# Patient Record
Sex: Male | Born: 1980 | Hispanic: No | Marital: Single | Smoking: Never smoker
Health system: Southern US, Community
[De-identification: ages and names within clinical notes are randomized; demographics above are authoritative.]

## PROBLEM LIST (undated history)

## (undated) DIAGNOSIS — G510 Bell's palsy: Secondary | ICD-10-CM

## (undated) HISTORY — DX: Bell's palsy: G51.0

---

## 2002-01-26 ENCOUNTER — Encounter: Payer: Self-pay | Admitting: Internal Medicine

## 2002-01-26 ENCOUNTER — Ambulatory Visit (HOSPITAL_COMMUNITY): Admission: RE | Admit: 2002-01-26 | Discharge: 2002-01-26 | Payer: Self-pay | Admitting: Internal Medicine

## 2003-03-16 ENCOUNTER — Emergency Department (HOSPITAL_COMMUNITY): Admission: EM | Admit: 2003-03-16 | Discharge: 2003-03-16 | Payer: Self-pay | Admitting: Emergency Medicine

## 2003-09-02 ENCOUNTER — Encounter: Admission: RE | Admit: 2003-09-02 | Discharge: 2003-09-02 | Payer: Self-pay | Admitting: Internal Medicine

## 2003-09-26 ENCOUNTER — Encounter: Admission: RE | Admit: 2003-09-26 | Discharge: 2003-09-26 | Payer: Self-pay | Admitting: Internal Medicine

## 2009-02-06 ENCOUNTER — Encounter: Payer: Self-pay | Admitting: *Deleted

## 2009-02-15 ENCOUNTER — Emergency Department (HOSPITAL_COMMUNITY): Admission: EM | Admit: 2009-02-15 | Discharge: 2009-02-15 | Payer: Self-pay | Admitting: Emergency Medicine

## 2009-10-31 ENCOUNTER — Emergency Department (HOSPITAL_COMMUNITY): Admission: EM | Admit: 2009-10-31 | Discharge: 2009-10-31 | Payer: Self-pay | Admitting: Emergency Medicine

## 2009-11-10 ENCOUNTER — Emergency Department (HOSPITAL_COMMUNITY): Admission: EM | Admit: 2009-11-10 | Discharge: 2009-11-10 | Payer: Self-pay | Admitting: Emergency Medicine

## 2009-11-25 ENCOUNTER — Ambulatory Visit (HOSPITAL_COMMUNITY): Admission: RE | Admit: 2009-11-25 | Discharge: 2009-11-25 | Payer: Self-pay | Admitting: Emergency Medicine

## 2011-01-06 LAB — POCT I-STAT, CHEM 8
BUN: 15 mg/dL (ref 6–23)
Calcium, Ion: 1.13 mmol/L (ref 1.12–1.32)
Chloride: 101 mEq/L (ref 96–112)
Creatinine, Ser: 1 mg/dL (ref 0.4–1.5)
Glucose, Bld: 100 mg/dL — ABNORMAL HIGH (ref 70–99)
HCT: 47 % (ref 39.0–52.0)
Hemoglobin: 16 g/dL (ref 13.0–17.0)
Potassium: 3.4 mEq/L — ABNORMAL LOW (ref 3.5–5.1)
Sodium: 137 mEq/L (ref 135–145)
TCO2: 29 mmol/L (ref 0–100)

## 2011-01-30 LAB — POCT URINALYSIS DIP (DEVICE)
Bilirubin Urine: NEGATIVE
Glucose, UA: NEGATIVE mg/dL
Hgb urine dipstick: NEGATIVE
Ketones, ur: NEGATIVE mg/dL
Nitrite: NEGATIVE
Protein, ur: 30 mg/dL — AB
Specific Gravity, Urine: 1.02 (ref 1.005–1.030)
Urobilinogen, UA: 1 mg/dL (ref 0.0–1.0)
pH: 7 (ref 5.0–8.0)

## 2011-07-31 IMAGING — CT CT HEAD W/O CM
1 of 2 series · 13 of 30 positions shown, 17 images · non-contrast
Comparison: None

CLINICAL DATA: The left facial numbness.  Blurred vision.

CT HEAD WITHOUT CONTRAST
TECHNIQUE: Contiguous axial images were obtained from the base of
the skull through the vertex without contrast.

[Series 2: brain · axial · 0.47mm/px · z∈[+159,+292]mm · 13 of 32 slices shown, 17 images]
[im 3/32  brain]
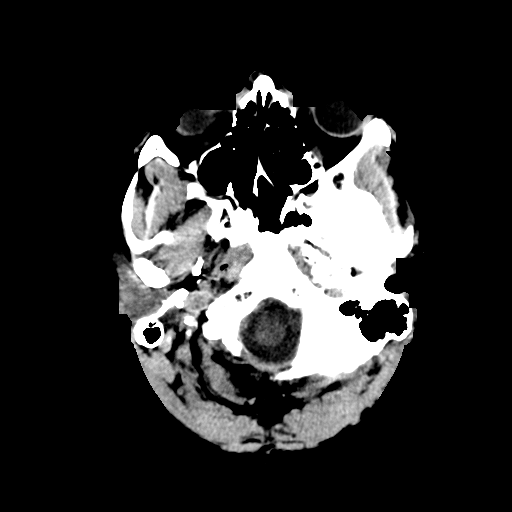
[im 3/32  bone]
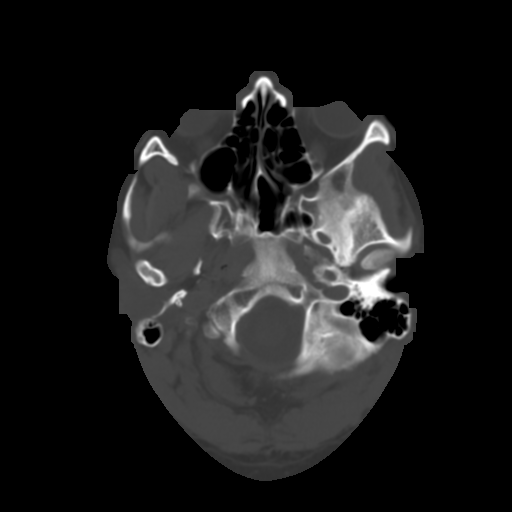
[im 5/32  brain]
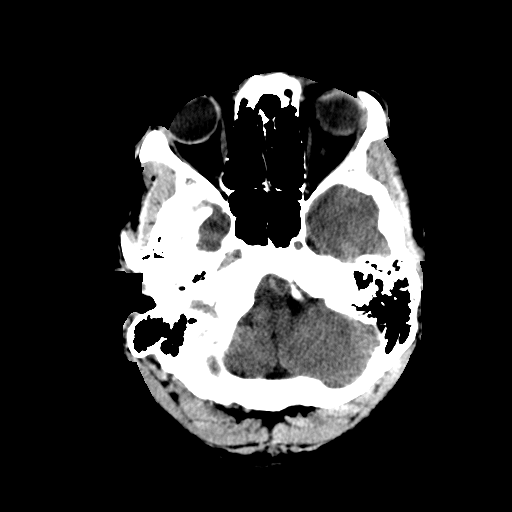
[im 7/32  brain]
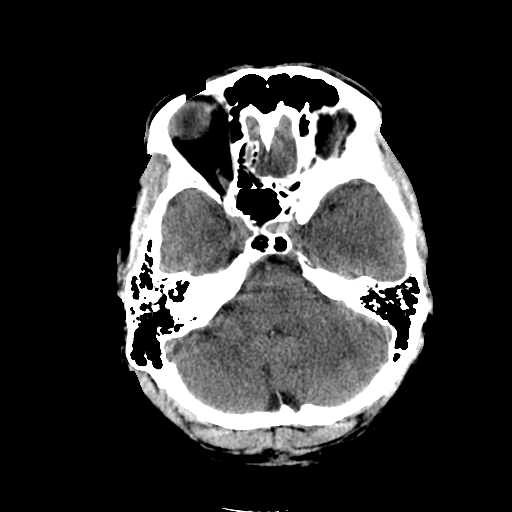
[im 9/32  brain]
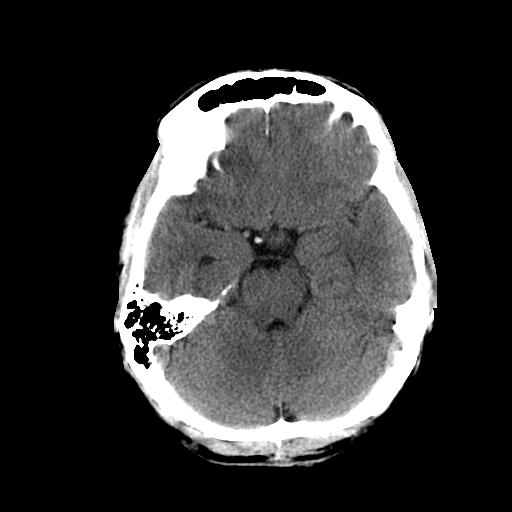
[im 12/32  brain]
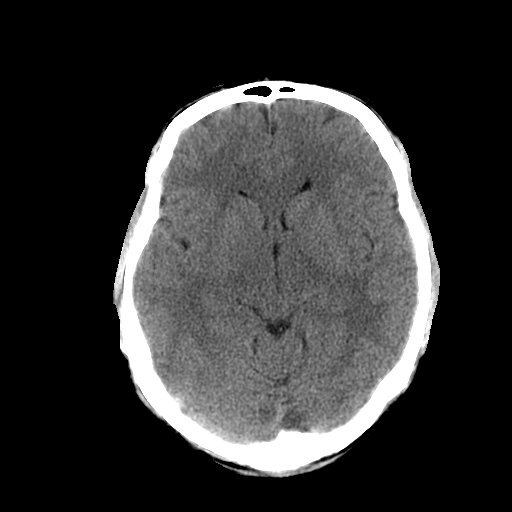
[im 12/32  bone]
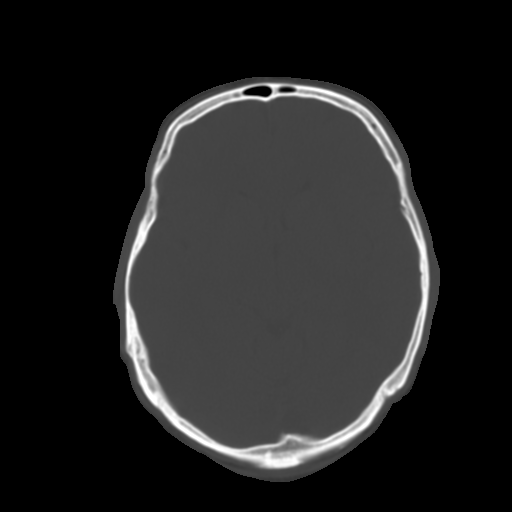
[im 14/32  brain]
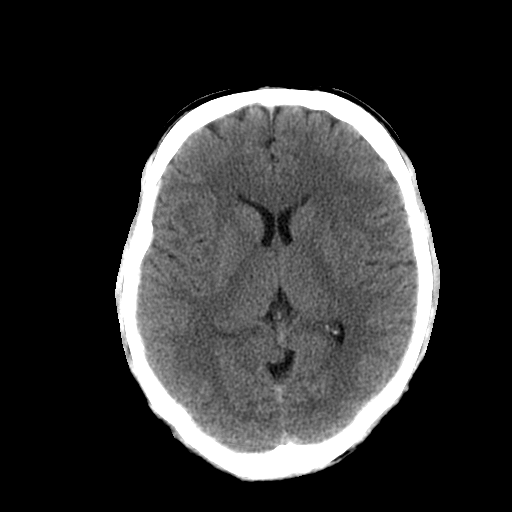
[im 16/32  brain]
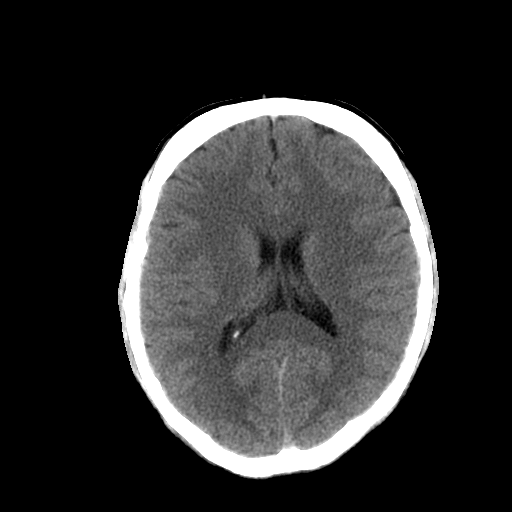
[im 18/32  brain]
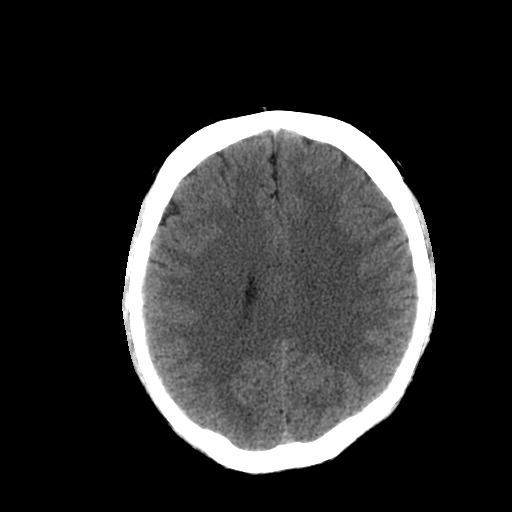
[im 20/32  brain]
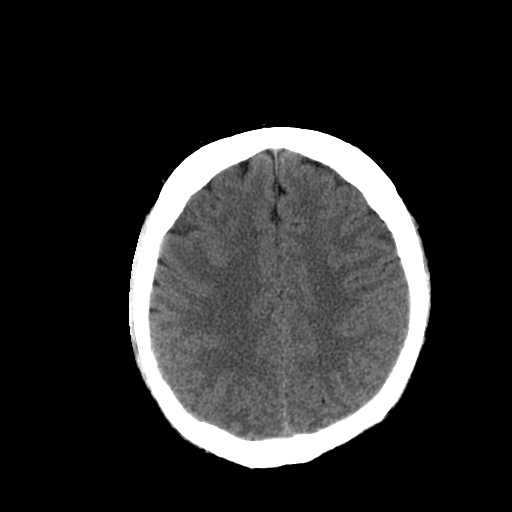
[im 20/32  bone]
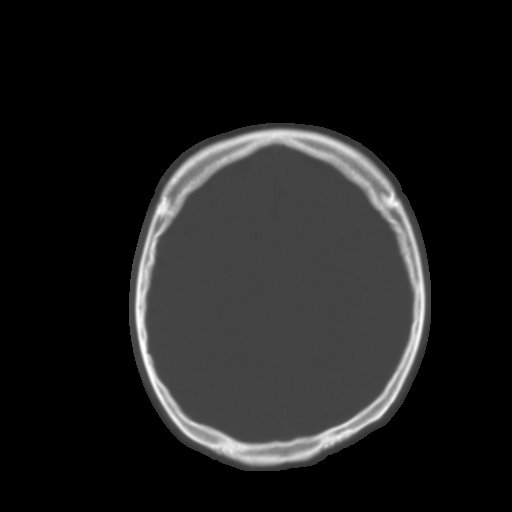
[im 23/32  brain]
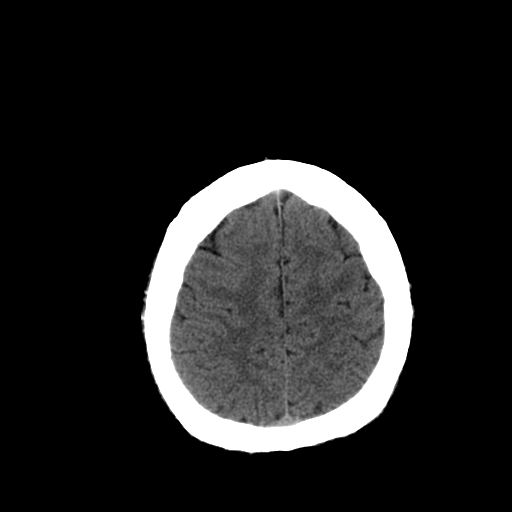
[im 25/32  brain]
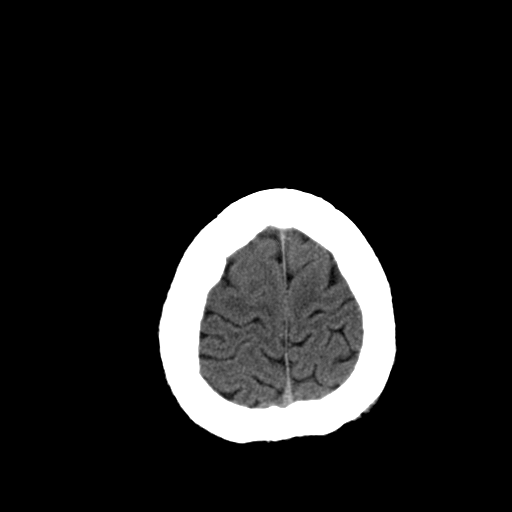
[im 27/32  brain]
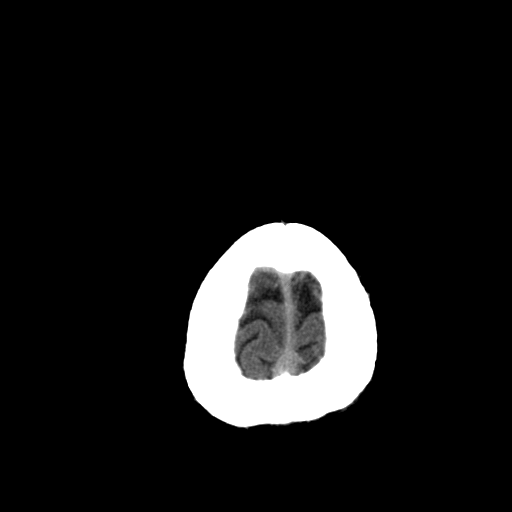
[im 29/32  brain]
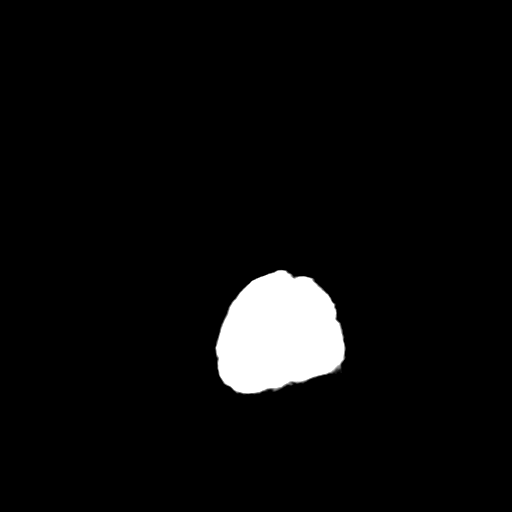
[im 29/32  bone]
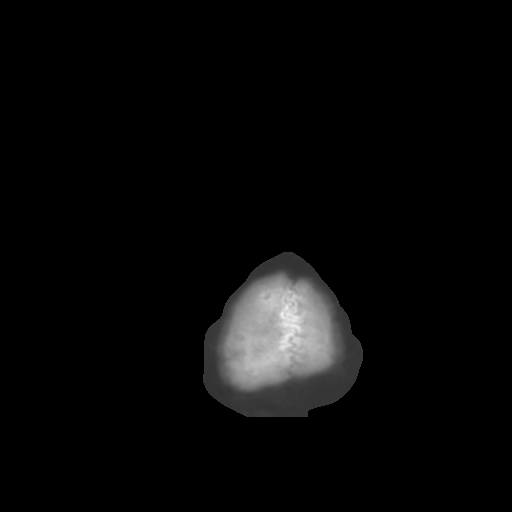

[13 of 30 positions shown; findings below may reference images not displayed]

FINDINGS: The brain has a normal appearance without evidence of
malformation, atrophy, old or acute infarction, mass lesion,
hemorrhage, hydrocephalus or extra-axial collection.  The calvarium
is normal.  The sinuses, middle ears and mastoids are clear.
IMPRESSION: Normal head CT

## 2012-10-15 ENCOUNTER — Ambulatory Visit (INDEPENDENT_AMBULATORY_CARE_PROVIDER_SITE_OTHER): Payer: BC Managed Care – PPO | Admitting: Emergency Medicine

## 2012-10-15 VITALS — BP 130/84 | HR 100 | Temp 100.7°F | Resp 16 | Ht 67.0 in | Wt 202.6 lb

## 2012-10-15 DIAGNOSIS — J111 Influenza due to unidentified influenza virus with other respiratory manifestations: Secondary | ICD-10-CM

## 2012-10-15 MED ORDER — OSELTAMIVIR PHOSPHATE 75 MG PO CAPS: 75.0000 mg | ORAL_CAPSULE | Freq: Two times a day (BID) | ORAL | Status: DC

## 2012-10-15 MED ORDER — HYDROCOD POLST-CHLORPHEN POLST 10-8 MG/5ML PO LQCR: 5.0000 mL | Freq: Two times a day (BID) | ORAL | Status: DC | PRN

## 2012-10-15 NOTE — Patient Instructions (Signed)
Influenza A (H1N1) (Influenza A [H1N1]) La gripe H1N1, llamada "gripe porcina" es producida por un nuevo virus de influenza. El virus H1N1 es diferente del virus de la influenza estacional. Las ltimas pruebas muestran que el virus H1N1 tiene genes similares a los del virus de la gripe que afecta a los cerdos en [place] y [place]. Los sntomas son similares a los de la gripe estacional y se disemina de persona a persona. Usted puede tener riesgo de sufrir mayores complicaciones si tiene una enfermedad crnica subyacente. El CDC y la [employer] la [employer]ud estn controlando los casos informados en todo el mundo. CAUSAS  La gripe se contagia a travs de las gotitas que se eliminan con la tos y los estornudos.  Una persona puede infectarse tocando algo que contenga el virus y luego tocndose la boca o la nariz. SNTOMAS  Fiebre.  Dolor de cabeza.  Cansancio.  Tos.  Dolor de garganta.  Congestin nasal o que gotea.  Dolores en el cuerpo.  Tambin pueden existir diarrea y vmitos. Estos sntomas se conocen como "sntomas caractersticos de la gripe". Muchas enfermedades distintas, incluso el resfro comn, pueden tener sntomas similares. DIAGNSTICO  Existen anlisis que permiten diagnosticar si usted tiene el virus H1N1.  Todos los casos confirmados sern informados al departamento de salud estatal o local.  Puede ser necesario que su mdico le realice pruebas para determinar si tiene otra infeccin que pueda ser una complicacin de la gripe. INSTRUCCIONES PARA EL CUIDADO DOMICILIARIO  Busque ayuda de inmediato si desarrolla alguno de los siguientes sntomas.  Si est en riesgo de sufrir complicaciones de la gripe, deber consultar a un profesional de la salud cuando comiencen los sntomas. Las personas que tienen un alto riesgo de complicaciones son:  Mayores de 65 aos de edad.  Pacientes con enfermedades crnicas.  Mujeres embarazadas.  Nios pequeos.  El  profesional que lo asiste podr recomendar la utilizacin de ciertas drogas antivirales para el tratamiento de la gripe.  Si contrae gripe, es aconsejable que descanse lo suficiente, beba gran cantidad de lquidos y evite el consumo de alcohol y tabaco.  Tambin puede tomar medicamentos de venta libre que alivien los sntomas de la gripe, si se lo permite el profesional que lo asiste. (Nunca de aspirina a nios o adolescentes que tienen sntomas de la gripe, particularmente fiebre). TRATAMIENTO Si contrae la enfermedad, hay drogas antivirales disponibles. Estos medicamentos pueden hacer que su enfermedad sea ms leve y a que mejore ms rpidamente. El tratamiento debe comenzar poco despus del inicio de la enfermedad. Slo el mdico puede prescribir medicamentos antivirales. PREVENCIN  Cbrase la boca y la nariz con un tis o con la mano cuando tosa o estornude. Descarte el tis.  Lave sus manos frecuentemente con agua y jabn. Utilice un limpiador a base de alcohol si no dispone de agua.  Evite tocarse los ojos, la nariz o la boca.  Trate de evitar el contacto con personas enfermas.  Si est enfermo, qudese en su casa y no concurra al trabajo o a la escuela para evitar el contagio a otras personas. Las personas infectadas con el virus H1N1 pueden infectar a otras desde un da antes de tener los sntomas hasta 5 a 7 das despus.  Hay disponible una vacuna que ayuda a la proteccin contra el virus H1N1. Adems de esta vacuna, deber vacunarse contra la gripe estacional. Estas dos vacunas deben aplicarse el mismo da. El CDC recomienda especialmente la vacuna contra el virus H1N1 en:    Mujeres embarazadas.  Personas que viven con nios menores de 6 meses o cuidan de ellos.  Personal de los servicios de salud y emergencias.  Personas entre los 6 meses y los 24 aos de edad.  Personas entre 25 y 64 aos que tienen ms riesgo si contraen el virus H1N1 por que padecen enfermedades crnicas o  porque tienen compromiso de su sistema inmunolgico. BARBIJOS: En comunidades y hogares no se recomienda el uso de mascarillas ni barbijos N95. En ciertas circunstancias, pueden utilizarlos las personas que tienen ms riesgo de desarrollar cuadros graves de influenza. Su mdico le podr dar otras tras recomendaciones para el uso de las mascarillas. SIGNOS DE ALERTA POR TRATARSE DE UNA EMERGENCIA QUE NECESITAN ATENCIN MDICA DE URGENCIA TANTO EN NIOS :  Respiracin rpida o problemas para respirar.  Color de piel azulado.  No bebe suficiente cantidad de lquidos.  No se despierta o no interacta.  Est tan irritable que no quiere que se lo cargue.  Su nio tienen una temperatura oral de ms de 102 F (38.9 C) y no puede controlarla con medicamentos.  Su beb tiene ms de 3 meses y su temperatura rectal es de 102 F (38.9 C) o ms.  Su beb tiene 3 meses o menos y su temperatura rectal es de 100.4 F (38 C) o ms.  Fiebre con erupcin cutnea. SIGNOS DE ALERTA POR TRATARSE DE UNA EMERGENCIA QUE NECESITAN ATENCIN MDICA DE URGENCIA TANTO EN ADULTOS:  Le falta la respiracin o presenta dificultades respiratorias.  Dolor o presin en el pecho o abdomen.  Mareos repentinos.  Confusin.  Vmitos intensos y persistentes.  Est tan irritable que no quiere que se lo cargue.  Usted una temperatura oral de ms de 102 F (38.9 C) y no puede controlarla con medicamentos.  Los sntomas mejoran pero luego vuelven con fiebre y la tos empeora. SOLICITE ATENCIN MDICA DE INMEDIATO SI OBSERVA:  Usted o alguien que conoce tiene los sntomas descritos arriba. Cuando llegue al centro de emergencias, comunique en la recepcin que cree tener gripe. Es posible que le pidan que utilice una mscara y/o ubicarse en un rea aislada para evitar que otros se contagien. EST SEGURO QUE:   Comprende las instrucciones para el alta mdica.  Controlar su enfermedad.  Solicitar atencin mdica de  inmediato segn las indicaciones. Parte de esta informacin es cortesa de los CDC. Document Released: 03/25/2008 Document Revised: 12/30/2011 [employer] Patient Information 2013 [employer].  

## 2012-10-15 NOTE — Progress Notes (Signed)
[facility] [address], [place] [address] [phone]  Date:  10/15/2012   Name:  Albert Farrell   DOB:  [birth year]   MRN:  [id number]  PCP:  No primary provider on file.    Chief Complaint: Headache and Sore Throat   History of Present Illness:  Albert Farrell is a 31 y.o. very pleasant male patient who presents with the following:  Ill since Tuesday with fever and chills, malaise and myalgias and a cough that is non productive mostly but occasionally productive of purulent sputum.  No nasal drainage or post nasal drip.  No wheezing or shortness of breath.  No improvement with OTC medication.  No stool change or rash.  No improvement with OTC medication.  Multiple family members treated with flu.  There is no problem list on file for this patient.   No past medical history on file.  No past surgical history on file.  History  Substance Use Topics  . Smoking status: Never Smoker   . Smokeless tobacco: Not on file  . Alcohol Use: Not on file    No family history on file.  No Known Allergies  Medication list has been reviewed and updated.  No current outpatient prescriptions on file prior to visit.    Review of Systems:  As per HPI, otherwise negative.    Physical Examination: Filed Vitals:   10/15/12 1654  BP: 130/84  Pulse: 100  Temp: 100.7 F (38.2 C)  Resp: 16   Filed Vitals:   10/15/12 1654  Height: 5\' 7"  (1.702 m)  Weight: 202 lb 9.6 oz (91.899 kg)   Body mass index is 31.73 kg/(m^2). Ideal Body Weight: Weight in (lb) to have BMI = 25: 159.3   GEN: WDWN, NAD, Non-toxic, A & O x 3 HEENT: Atraumatic, Normocephalic. Neck supple. No masses, No LAD. Ears and Nose: No external deformity. CV: RRR, No M/G/R. No JVD. No thrill. No extra heart sounds. PULM: CTA B, no wheezes, crackles, rhonchi. No retractions. No resp. distress. No accessory muscle use. ABD: S, NT, ND, +BS. No rebound. No HSM. EXTR: No c/c/e NEURO  Normal gait.  PSYCH: Normally interactive. Conversant. Not depressed or anxious appearing.  Calm demeanor.    Assessment and Plan: Influenza tamiflu tussionex Follow up as needed  Carmelina Dane, MD

## 2012-10-16 ENCOUNTER — Ambulatory Visit (INDEPENDENT_AMBULATORY_CARE_PROVIDER_SITE_OTHER): Payer: BC Managed Care – PPO | Admitting: Emergency Medicine

## 2012-10-16 VITALS — BP 118/80 | HR 80 | Temp 98.0°F | Resp 18 | Ht 68.5 in | Wt 199.6 lb

## 2012-10-16 DIAGNOSIS — Z0000 Encounter for general adult medical examination without abnormal findings: Secondary | ICD-10-CM

## 2012-10-16 DIAGNOSIS — J111 Influenza due to unidentified influenza virus with other respiratory manifestations: Secondary | ICD-10-CM

## 2012-10-16 LAB — POCT CBC
Hemoglobin: 16.5 g/dL (ref 14.1–18.1)
Lymph, poc: 1.7 (ref 0.6–3.4)
MCH, POC: 30 pg (ref 27–31.2)
MCHC: 31.7 g/dL — AB (ref 31.8–35.4)
MID (cbc): 0.7 (ref 0–0.9)
MPV: 10 fL (ref 0–99.8)
POC Granulocyte: 8.4 — AB (ref 2–6.9)
POC MID %: 6.1 %M (ref 0–12)
Platelet Count, POC: 219 10*3/uL (ref 142–424)
WBC: 10.7 10*3/uL — AB (ref 4.6–10.2)

## 2012-10-16 LAB — TSH: TSH: 1.365 u[IU]/mL (ref 0.350–4.500)

## 2012-10-16 NOTE — Progress Notes (Signed)
[facility] [address], [place] [address] [phone]  Date:  10/16/2012   Name:  Albert Farrell   DOB:  [birth year]   MRN:  [id number]  PCP:  No primary provider on file.    Chief Complaint: Blood work and Headache   History of Present Illness:  Albert Farrell is a 31 y.o. very pleasant male patient who presents with the following:  Treated yesterday for influenza and has come in today requesting labs to check cholesterol, etc.  Interval has been improvement with medication.  Has some nausea and dizziness intermittently with no other neuro or visual complaints.    There is no problem list on file for this patient.   No past medical history on file.  No past surgical history on file.  History  Substance Use Topics  . Smoking status: Never Smoker   . Smokeless tobacco: Not on file  . Alcohol Use: Not on file    No family history on file.  No Known Allergies  Medication list has been reviewed and updated.  Current Outpatient Prescriptions on File Prior to Visit  Medication Sig Dispense Refill  . chlorpheniramine-HYDROcodone (TUSSIONEX PENNKINETIC ER) 10-8 MG/5ML LQCR Take 5 mLs by mouth every 12 (twelve) hours as needed (cough).  60 mL  0  . oseltamivir (TAMIFLU) 75 MG capsule Take 1 capsule (75 mg total) by mouth 2 (two) times daily.  10 capsule  0  . acetaminophen (TYLENOL) 500 MG tablet Take 500 mg by mouth every 6 (six) hours as needed.      . pseudoephedrine-acetaminophen (TYLENOL SINUS) 30-500 MG TABS Take 1 tablet by mouth every 4 (four) hours as needed.        Review of Systems:  As per HPI, otherwise negative.    Physical Examination: Filed Vitals:   10/16/12 1218  BP: 118/80  Pulse: 80  Temp: 98 F (36.7 C)  Resp: 18   Filed Vitals:   10/16/12 1218  Height: 5' 8.5" (1.74 m)  Weight: 199 lb 9.6 oz (90.538 kg)   Body mass index is 29.91 kg/(m^2). Ideal Body Weight: Weight in (lb) to have BMI = 25: 166.5    GEN: WDWN, NAD, Non-toxic, A & O x 3 HEENT: Atraumatic, Normocephalic. Neck supple. No masses, No LAD. Ears and Nose: No external deformity. CV: RRR, No M/G/R. No JVD. No thrill. No extra heart sounds. PULM: CTA B, no wheezes, crackles, rhonchi. No retractions. No resp. distress. No accessory muscle use. ABD: S, NT, ND, +BS. No rebound. No HSM. EXTR: No c/c/e NEURO Normal gait, balance and coordination.  PRRERLA EOMI  CN 2-12 intact PSYCH: Normally interactive. Conversant. Not depressed or anxious appearing.  Calm demeanor.    Assessment and Plan: Influenza Wellness check Labs Follow up based on labs Continue meds  Carmelina Dane, MD

## 2012-10-17 LAB — COMPREHENSIVE METABOLIC PANEL
ALT: 14 U/L (ref 0–53)
AST: 15 U/L (ref 0–37)
Alkaline Phosphatase: 43 U/L (ref 39–117)
CO2: 27 mEq/L (ref 19–32)
Creat: 0.98 mg/dL (ref 0.50–1.35)
Sodium: 136 mEq/L (ref 135–145)
Total Bilirubin: 0.5 mg/dL (ref 0.3–1.2)
Total Protein: 7.9 g/dL (ref 6.0–8.3)

## 2012-10-17 LAB — LIPID PANEL
LDL Cholesterol: 102 mg/dL — ABNORMAL HIGH (ref 0–99)
Total CHOL/HDL Ratio: 4.1 Ratio
VLDL: 25 mg/dL (ref 0–40)

## 2015-07-20 ENCOUNTER — Ambulatory Visit (INDEPENDENT_AMBULATORY_CARE_PROVIDER_SITE_OTHER): Payer: BLUE CROSS/BLUE SHIELD | Admitting: Family Medicine

## 2015-07-20 VITALS — BP 120/80 | HR 82 | Temp 98.3°F | Resp 16 | Ht 68.5 in | Wt 204.0 lb

## 2015-07-20 DIAGNOSIS — R519 Headache, unspecified: Secondary | ICD-10-CM

## 2015-07-20 DIAGNOSIS — Z1329 Encounter for screening for other suspected endocrine disorder: Secondary | ICD-10-CM

## 2015-07-20 DIAGNOSIS — Z13220 Encounter for screening for lipoid disorders: Secondary | ICD-10-CM

## 2015-07-20 DIAGNOSIS — Z131 Encounter for screening for diabetes mellitus: Secondary | ICD-10-CM

## 2015-07-20 DIAGNOSIS — R51 Headache: Secondary | ICD-10-CM | POA: Diagnosis not present

## 2015-07-20 LAB — POCT CBC
Granulocyte percent: 59.6 %G (ref 37–80)
HCT, POC: 49 % (ref 43.5–53.7)
Hemoglobin: 16.1 g/dL (ref 14.1–18.1)
LYMPH, POC: 3 (ref 0.6–3.4)
MCH, POC: 29.3 pg (ref 27–31.2)
MCHC: 32.8 g/dL (ref 31.8–35.4)
MCV: 89.4 fL (ref 80–97)
MID (CBC): 0.4 (ref 0–0.9)
MPV: 8.4 fL (ref 0–99.8)
PLATELET COUNT, POC: 222 10*3/uL (ref 142–424)
POC Granulocyte: 4.9 (ref 2–6.9)
POC LYMPH %: 35.6 % (ref 10–50)
POC MID %: 4.8 % (ref 0–12)
RBC: 5.48 M/uL (ref 4.69–6.13)
RDW, POC: 14.4 %
WBC: 8.3 10*3/uL (ref 4.6–10.2)

## 2015-07-20 LAB — COMPREHENSIVE METABOLIC PANEL
ALBUMIN: 4.6 g/dL (ref 3.6–5.1)
ALT: 57 U/L — AB (ref 9–46)
AST: 31 U/L (ref 10–40)
Alkaline Phosphatase: 49 U/L (ref 40–115)
BILIRUBIN TOTAL: 0.6 mg/dL (ref 0.2–1.2)
BUN: 15 mg/dL (ref 7–25)
CHLORIDE: 102 mmol/L (ref 98–110)
CO2: 25 mmol/L (ref 20–31)
CREATININE: 0.91 mg/dL (ref 0.60–1.35)
Calcium: 9.5 mg/dL (ref 8.6–10.3)
Glucose, Bld: 105 mg/dL — ABNORMAL HIGH (ref 65–99)
Potassium: 4.1 mmol/L (ref 3.5–5.3)
SODIUM: 136 mmol/L (ref 135–146)
TOTAL PROTEIN: 7.4 g/dL (ref 6.1–8.1)

## 2015-07-20 LAB — LIPID PANEL
CHOL/HDL RATIO: 6 ratio — AB (ref <=5.0)
Cholesterol: 227 mg/dL — ABNORMAL HIGH (ref 125–200)
HDL: 38 mg/dL — AB (ref >=40)
LDL CALC: 115 mg/dL (ref <130)
Triglycerides: 368 mg/dL — ABNORMAL HIGH (ref <150)
VLDL: 74 mg/dL — ABNORMAL HIGH (ref <30)

## 2015-07-20 LAB — GLUCOSE, POCT (MANUAL RESULT ENTRY): POC Glucose: 111 mg/dl — AB (ref 70–99)

## 2015-07-20 LAB — POCT GLYCOSYLATED HEMOGLOBIN (HGB A1C): HEMOGLOBIN A1C: 5.6

## 2015-07-20 MED ORDER — SUMATRIPTAN SUCCINATE 50 MG PO TABS: ORAL_TABLET | ORAL | Status: DC

## 2015-07-20 NOTE — Patient Instructions (Signed)
Try to avoid overheating and stay hydrated while at work.   If you get one of the severe headaches, take 1 imitrex pill right away.  You can take a 2nd pill in 2 hours if headache persists  I will be in touch with the rest of your labs Let me know if the medication does not work or if you continue to have these headaches, or if any other concerning symptoms

## 2015-07-20 NOTE — Progress Notes (Addendum)
[facility] [address], [place] [address] [phone]  Date:  07/20/2015   Name:  Albert Farrell   DOB:  06-06-1981   MRN:  [id number]  PCP:  No primary care provider on file.    Chief Complaint: Headache; Emesis; Fatigue; No peripheral vision; and Depression   History of Present Illness:  Albert Farrell is a 34 y.o. very pleasant male patient who presents with the following:  Last seen here in December of 2013. Here today with complaint of illness- described as below.    This past Friday (today is Thursday) he had a headache on the right side of his head.  When he was driving home he felt like he could not see well in his peripheral vision- vision to the front ok.  He got home from work, felt ill and vomited.  After vomiting he felt better, but kept having a headache the rest of that day.   The next day he noted body aches.  He worked Saturday, felt better Sunday and Monday.  On Tuesday he again had a headache, drove home, felt like his peripheral vision was blurry or distorted.  He also vomited again.   Yesterday he worked, but did take it a little easier and he felt ok, excpet he had a mild headache and took tylenol.  Today he took the day off and has felt ok, no headache.    He does work outside in the heat.  He is generally in good health- did have bell's palsy a few years ago.    He does not notice particular phono or photophobia when he has the HA  He does not use glasses or contacts. His vision is currently normal, no issues with peripheral vision either He has generally been in good health Here today with his wife He is a non- smoker There are no active problems to display for this patient.   Past Medical History  Diagnosis Date  . Bell palsy Left side    5 years ago    History reviewed. No pertinent past surgical history.  Social History  Substance Use Topics  . Smoking status: Never Smoker   . Smokeless tobacco: None  .  Alcohol Use: None    History reviewed. No pertinent family history.  No Known Allergies  Medication list has been reviewed and updated.  No current outpatient prescriptions on file prior to visit.   No current facility-administered medications on file prior to visit.    Review of Systems:  As per HPI- otherwise negative.   Physical Examination: Filed Vitals:   07/20/15 1103  BP: 120/80  Pulse: 82  Temp: 98.3 F (36.8 C)  Resp: 16   Filed Vitals:   07/20/15 1103  Height: 5' 8.5" (1.74 m)  Weight: 204 lb (92.534 kg)   Body mass index is 30.56 kg/(m^2). Ideal Body Weight: Weight in (lb) to have BMI = 25: 166.5  GEN: WDWN, NAD, Non-toxic, A & O x 3, looks well, overweight HEENT: Atraumatic, Normocephalic. Neck supple. No masses, No LAD.  Bilateral TM wnl, oropharynx normal.  PEERL,EOMI.   He has no trouble perceiving object in his peripheral vision at 85 degrees bilaterally Ears and Nose: No external deformity. CV: RRR, No M/G/R. No JVD. No thrill. No extra heart sounds. PULM: CTA B, no wheezes, crackles, rhonchi. No retractions. No resp. distress. No accessory muscle use. ABD: S, NT, ND. No rebound. No HSM. EXTR: No c/c/e NEURO Normal  gait.  PSYCH: Normally interactive. Conversant. Not depressed or anxious appearing.  Calm demeanor.  Normal strength, DTR, sensation in all limbs.    Results for orders placed or performed in visit on 07/20/15  POCT CBC  Result Value Ref Range   WBC 8.3 4.6 - 10.2 K/uL   Lymph, poc 3.0 0.6 - 3.4   POC LYMPH PERCENT 35.6 10 - 50 %L   MID (cbc) 0.4 0 - 0.9   POC MID % 4.8 0 - 12 %M   POC Granulocyte 4.9 2 - 6.9   Granulocyte percent 59.6 37 - 80 %G   RBC 5.48 4.69 - 6.13 M/uL   Hemoglobin 16.1 14.1 - 18.1 g/dL   HCT, POC 16.1 09.6 - 53.7 %   MCV 89.4 80 - 97 fL   MCH, POC 29.3 27 - 31.2 pg   MCHC 32.8 31.8 - 35.4 g/dL   RDW, POC 04.5 %   Platelet Count, POC 222 142 - 424 K/uL   MPV 8.4 0 - 99.8 fL  POCT glucose (manual  entry)  Result Value Ref Range   POC Glucose 111 (A) 70 - 99 mg/dl  POCT glycosylated hemoglobin (Hb A1C)  Result Value Ref Range   Hemoglobin A1C 5.6     Assessment and Plan: Increased frequency of headaches - Plan: POCT CBC, POCT glucose (manual entry), Comprehensive metabolic panel, SUMAtriptan (IMITREX) 50 MG tablet  Screening for hyperlipidemia - Plan: Lipid panel  Screening for diabetes mellitus - Plan: POCT glycosylated hemoglobin (Hb A1C)  Screening for thyroid disorder - Plan: TSH  Here today to evaluate right sided HA with associated vomiting that has occurred a few times over the last few days after working all day in the heat.  Today he has rested and feels fine, no symptoms Labs so far reassuring It does sound like he may be having migraine HA, perhaps triggered by heat and dehydration Await the rest of his labs rx for imitrex to try the next time he has this HA If HA persist or worsen he will let me know    Signed Abbe Amsterdam, MD  Received the rest of his labs, called 9/30.  Home number- no answer, no name given so not sure if correct.  Someone else answered his mobile number- they did not speak much English, but did not seem to be the pt who answered.  Will send him a letter   Results for orders placed or performed in visit on 07/20/15  Comprehensive metabolic panel  Result Value Ref Range   Sodium 136 135 - 146 mmol/L   Potassium 4.1 3.5 - 5.3 mmol/L   Chloride 102 98 - 110 mmol/L   CO2 25 20 - 31 mmol/L   Glucose, Bld 105 (H) 65 - 99 mg/dL   BUN 15 7 - 25 mg/dL   Creat 4.09 8.11 - 9.14 mg/dL   Total Bilirubin 0.6 0.2 - 1.2 mg/dL   Alkaline Phosphatase 49 40 - 115 U/L   AST 31 10 - 40 U/L   ALT 57 (H) 9 - 46 U/L   Total Protein 7.4 6.1 - 8.1 g/dL   Albumin 4.6 3.6 - 5.1 g/dL   Calcium 9.5 8.6 - 78.2 mg/dL  Lipid panel  Result Value Ref Range   Cholesterol 227 (H) 125 - 200 mg/dL   Triglycerides 956 (H) <150 mg/dL   HDL 38 (L) >=21 mg/dL   Total  CHOL/HDL Ratio 6.0 (H) <=5.0 Ratio   VLDL 74 (H) <  30 mg/dL   LDL Cholesterol 098 <119 mg/dL  TSH  Result Value Ref Range   TSH 0.719 0.350 - 4.500 uIU/mL  POCT CBC  Result Value Ref Range   WBC 8.3 4.6 - 10.2 K/uL   Lymph, poc 3.0 0.6 - 3.4   POC LYMPH PERCENT 35.6 10 - 50 %L   MID (cbc) 0.4 0 - 0.9   POC MID % 4.8 0 - 12 %M   POC Granulocyte 4.9 2 - 6.9   Granulocyte percent 59.6 37 - 80 %G   RBC 5.48 4.69 - 6.13 M/uL   Hemoglobin 16.1 14.1 - 18.1 g/dL   HCT, POC 14.7 82.9 - 53.7 %   MCV 89.4 80 - 97 fL   MCH, POC 29.3 27 - 31.2 pg   MCHC 32.8 31.8 - 35.4 g/dL   RDW, POC 56.2 %   Platelet Count, POC 222 142 - 424 K/uL   MPV 8.4 0 - 99.8 fL  POCT glucose (manual entry)  Result Value Ref Range   POC Glucose 111 (A) 70 - 99 mg/dl  POCT glycosylated hemoglobin (Hb A1C)  Result Value Ref Range   Hemoglobin A1C 5.6

## 2015-07-21 ENCOUNTER — Encounter: Payer: Self-pay | Admitting: Family Medicine

## 2015-07-21 LAB — TSH: TSH: 0.719 u[IU]/mL (ref 0.350–4.500)

## 2016-06-26 ENCOUNTER — Ambulatory Visit (INDEPENDENT_AMBULATORY_CARE_PROVIDER_SITE_OTHER): Payer: BLUE CROSS/BLUE SHIELD | Admitting: Family Medicine

## 2016-06-26 VITALS — BP 136/88 | HR 84 | Temp 98.0°F | Resp 18 | Ht 68.5 in | Wt 214.0 lb

## 2016-06-26 DIAGNOSIS — Z136 Encounter for screening for cardiovascular disorders: Secondary | ICD-10-CM | POA: Diagnosis not present

## 2016-06-26 DIAGNOSIS — R51 Headache: Secondary | ICD-10-CM | POA: Diagnosis not present

## 2016-06-26 DIAGNOSIS — R519 Headache, unspecified: Secondary | ICD-10-CM

## 2016-06-26 DIAGNOSIS — Z131 Encounter for screening for diabetes mellitus: Secondary | ICD-10-CM | POA: Diagnosis not present

## 2016-06-26 LAB — COMPLETE METABOLIC PANEL WITH GFR
ALBUMIN: 4.8 g/dL (ref 3.6–5.1)
ALK PHOS: 51 U/L (ref 40–115)
ALT: 32 U/L (ref 9–46)
AST: 22 U/L (ref 10–40)
BILIRUBIN TOTAL: 0.7 mg/dL (ref 0.2–1.2)
BUN: 13 mg/dL (ref 7–25)
CO2: 23 mmol/L (ref 20–31)
CREATININE: 0.85 mg/dL (ref 0.60–1.35)
Calcium: 9.8 mg/dL (ref 8.6–10.3)
Chloride: 100 mmol/L (ref 98–110)
GLUCOSE: 96 mg/dL (ref 65–99)
Potassium: 4.1 mmol/L (ref 3.5–5.3)
SODIUM: 134 mmol/L — AB (ref 135–146)
TOTAL PROTEIN: 8.1 g/dL (ref 6.1–8.1)

## 2016-06-26 LAB — LIPID PANEL
Cholesterol: 206 mg/dL — ABNORMAL HIGH (ref 125–200)
HDL: 45 mg/dL (ref >=40)
LDL Cholesterol: 136 mg/dL — ABNORMAL HIGH (ref <130)
Total CHOL/HDL Ratio: 4.6 Ratio (ref <=5.0)
Triglycerides: 126 mg/dL (ref <150)
VLDL: 25 mg/dL (ref <30)

## 2016-06-26 MED ORDER — TOPIRAMATE 50 MG PO TABS: 50.0000 mg | ORAL_TABLET | Freq: Two times a day (BID) | ORAL | 0 refills | Status: AC

## 2016-06-26 MED ORDER — TIZANIDINE HCL 6 MG PO CAPS: 6.0000 mg | ORAL_CAPSULE | Freq: Three times a day (TID) | ORAL | 0 refills | Status: AC

## 2016-06-26 NOTE — Progress Notes (Signed)
   Patient ID: Albert Farrell, male    DOB: 08/07/1981, 35 y.o.   MRN: [id number]  PCP: No PCP Per Patient  Chief Complaint  Patient presents with  . Migraine    FLU SHOT  . Other    LABS  . Dizziness    Subjective:   HPI 35 year old male presents for evaluation of headaches. He describes blurry vision in his  right eye for about 30 minutes.  Next, he reports developing headache, nausea with upset stomach.  Only fixed by sleeping.  He had a similar headache.  History of bells palsy 7 years ago. Reports currently feeling some lightheaded. He took Imitrex one year go which he resolved the headache short-term.   . Social History   Social History  . Marital status: Single    Spouse name: N/A  . Number of children: N/A  . Years of education: N/A   Occupational History  . Not on file.   Social History Main Topics  . Smoking status: Never Smoker  . Smokeless tobacco: Never Used  . Alcohol use No  . Drug use: No  . Sexual activity: Not on file   Other Topics Concern  . Not on file   Social History Narrative  . No narrative on file    No family history on file.   Review of Systems     There are no active problems to display for this patient.    Prior to Admission medications   Medication Sig Start Date End Date Taking? Authorizing Provider  acetaminophen (TYLENOL) 500 MG tablet Take 500 mg by mouth every 6 (six) hours as needed.    Historical Provider, MD  SUMAtriptan (IMITREX) 50 MG tablet Take 1 pill as needed for severe headache- repeat once as needed. Max 200 mg in 24 hours Patient not taking: Reported on 06/26/2016 07/20/15   Pearline CablesJessica C Copland, MD     No Known Allergies     Objective:  Physical Exam  Constitutional: He is oriented to person, place, and time. He appears well-developed and well-nourished.  HENT:  Head: Normocephalic and atraumatic.  Right Ear: External ear normal.  Left Ear: External ear normal.  Eyes: Conjunctivae and EOM are  normal. Pupils are equal, round, and reactive to light.  Neck: Normal range of motion. Neck supple.  Cardiovascular: Normal rate, regular rhythm, normal heart sounds and intact distal pulses.   Pulmonary/Chest: Effort normal and breath sounds normal.  Musculoskeletal: Normal range of motion.  Neurological: He is alert and oriented to person, place, and time.  Skin: Skin is warm and dry.  Psychiatric: He has a normal mood and affect. His behavior is normal. Judgment and thought content normal.    Vitals:   06/26/16 1424  BP: 136/88  Pulse: 84  Resp: 18  Temp: 98 F (36.7 C)   Assessment & Plan:  1. Intractable headache, unspecified chronicity pattern, unspecified headache type . . tizanidine (ZANAFLEX) 6 MG capsule    Sig: Take 1 capsule (6 mg total) by mouth 3 (three) times daily.  Marland Kitchen. topiramate (TOPAMAX) 50 MG tablet    Sig: Take 1 tablet (50 mg total) by mouth 2 (two) times daily.   2. Screening for cardiovascular condition - Lipid panel-Elevated  - TSH-unremarkable  3. Screening for diabetes mellitus - COMPLETE METABOLIC PANEL WITH GFR-unremarkable   Godfrey PickKimberly S. Tiburcio PeaHarris, MSN, FNP-C [facility]

## 2016-06-26 NOTE — Patient Instructions (Signed)
Topiramate for headache prevention Start: 25 mg, 1 pill at bedtime for 7 days. Increase to 25 mg twice daily after 7 days.  Do not abruptly stop medication.  Take Zanaflex 6 mg up to 3 times daily for acute headache pain.  Return for follow-up in 4 weeks to reevaluate treatment.   IF you received an x-ray today, you will receive an invoice from [facility]. Please contact [facility] at [phone] with questions or concerns regarding your invoice.   IF you received labwork today, you will receive an invoice from [facility]. Please contact [facility] at [phone] with questions or concerns regarding your invoice.   Our billing staff will not be able to assist you with questions regarding bills from these companies.  You will be contacted with the lab results as soon as they are available. The fastest way to get your results is to activate your My Chart account. Instructions are located on the last page of this paperwork. If you have not heard from us regarding the results in 2 weeks, please contact this office.

## 2016-06-27 ENCOUNTER — Encounter: Payer: Self-pay | Admitting: Family Medicine

## 2016-06-27 LAB — TSH: TSH: 0.57 m[IU]/L (ref 0.40–4.50)

## 2016-06-27 NOTE — Progress Notes (Signed)
June 27, 2016   Albert Farrell [address] [place] [address]   Dear Mr. [name],  Below are the results from your recent visit.  Your labs results were as expected with the exception of your LDL's (bad cholesterol) was slightly elevated.  This can be lowered by increasing physical activity, weight loss, ecreasing the amount of saturated fat foods and fried foods in diet.  Return to the office in 6 months for cholesterol recheck.  Resulted Orders  COMPLETE METABOLIC PANEL WITH GFR  Result Value Ref Range   Sodium 134 (L) 135 - 146 mmol/L   Potassium 4.1 3.5 - 5.3 mmol/L   Chloride 100 98 - 110 mmol/L   CO2 23 20 - 31 mmol/L   Glucose, Bld 96 65 - 99 mg/dL   BUN 13 7 - 25 mg/dL   Creat 6.04 5.40 - 9.81 mg/dL   Total Bilirubin 0.7 0.2 - 1.2 mg/dL   Alkaline Phosphatase 51 40 - 115 U/L   AST 22 10 - 40 U/L   ALT 32 9 - 46 U/L   Total Protein 8.1 6.1 - 8.1 g/dL   Albumin 4.8 3.6 - 5.1 g/dL   Calcium 9.8 8.6 - 19.1 mg/dL   GFR, Est African American >89 >=60 mL/min   GFR, Est Non African American >89 >=60 mL/min   Narrative   Performed at:  [facility]                [address], [address]                [place], [place] [address]  Lipid panel  Result Value Ref Range   Cholesterol 206 (H) 125 - 200 mg/dL   Triglycerides 130 <865 mg/dL   HDL 45 >=78 mg/dL   Total CHOL/HDL Ratio 4.6 <=5.0 Ratio   VLDL 25 <30 mg/dL   LDL Cholesterol 469 (H) <130 mg/dL     Comment:       Total Cholesterol/HDL Ratio:CHD Risk                        Coronary Heart Disease Risk Table                                        Men       Women          1/2 Average Risk              3.4        3.3              Average Risk              5.0        4.4           2X Average Risk              9.6        7.1           3X Average Risk             23.4       11.0 Use the calculated Patient Ratio above and the CHD Risk table  to determine the patient's CHD Risk.    Narrative    Performed at:  [facility]  [address], [address]                [place], [place] [address]  TSH  Result Value Ref Range   TSH 0.57 0.40 - 4.50 mIU/L   Narrative   Performed at:  [facility]                [address], [address]                [place], [place] [address]     If you have any questions or concerns, please don't hesitate to call.  Sincerely,   Godfrey PickKimberly S. Tiburcio PeaHarris, MSN, FNP-C [facility]

## 2017-02-05 ENCOUNTER — Ambulatory Visit (INDEPENDENT_AMBULATORY_CARE_PROVIDER_SITE_OTHER): Payer: BLUE CROSS/BLUE SHIELD | Admitting: Physician Assistant

## 2017-02-05 ENCOUNTER — Ambulatory Visit (INDEPENDENT_AMBULATORY_CARE_PROVIDER_SITE_OTHER): Payer: BLUE CROSS/BLUE SHIELD

## 2017-02-05 VITALS — BP 134/85 | HR 72 | Temp 98.6°F | Resp 18 | Ht 68.0 in | Wt 212.0 lb

## 2017-02-05 DIAGNOSIS — M722 Plantar fascial fibromatosis: Secondary | ICD-10-CM

## 2017-02-05 MED ORDER — MELOXICAM 15 MG PO TABS: 15.0000 mg | ORAL_TABLET | Freq: Every day | ORAL | 0 refills | Status: AC

## 2017-02-05 NOTE — Patient Instructions (Addendum)
Take Meloxicam once daily for heel pain.  Do not take any other pain relievers aside from Tylenol 1000 mg every 8 as needed for pain.      IF you received an x-ray today, you will receive an invoice from [facility]. Please contact [facility] at [phone] with questions or concerns regarding your invoice.   IF you received labwork today, you will receive an invoice from [facility]. Please contact [facility] at [phone] with questions or concerns regarding your invoice.   Our billing staff will not be able to assist you with questions regarding bills from these companies.  You will be contacted with the lab results as soon as they are available. The fastest way to get your results is to activate your My Chart account. Instructions are located on the last page of this paperwork. If you have not heard from [place] regarding the results in 2 weeks, please contact this office.

## 2017-02-05 NOTE — Progress Notes (Signed)
02/05/2017 1:12 PM   DOB: [birth year] / MRN: [id number]  SUBJECTIVE:  Albert Farrell is a 36 y.o. male presenting for bilateral heel pain left > right that started 2 months ago.  The pain is worse in the morning. He is not getting better or worse.  Denies calf pain, leg selling, ankle pain. He does Garment/textile technologist. He go a new pair of shoes about two weeks ago.  Has tired tylneol and this has not helped. Pain is worse in the morning and after a long day of work.   He has No Known Allergies.   He  has a past medical history of Bell palsy (Left side).    He  reports that he has never smoked. He has never used smokeless tobacco. He reports that he does not drink alcohol or use drugs. He  has no sexual activity history on file. The patient  has no past surgical history on file.  His family history is not on file.  Review of Systems  Musculoskeletal: Positive for myalgias. Negative for back pain, falls, joint pain and neck pain.    The problem list and medications were reviewed and updated by myself where necessary and exist elsewhere in the encounter.   OBJECTIVE:  BP 134/85   Pulse 72   Temp 98.6 F (37 C) (Oral)   Resp 18   Ht  (1.727 m)   Wt 212 lb (96.2 kg)   SpO2 100%   BMI 32.23 kg/m   Physical Exam  Constitutional: He is oriented to person, place, and time. He is active and cooperative.  Eyes: EOM are normal. Pupils are equal, round, and reactive to light.  Cardiovascular: Normal rate.   Pulmonary/Chest: Effort normal. No tachypnea.  Musculoskeletal: Normal range of motion. He exhibits tenderness (L>R, at the insertion of the plantar fascia on the calcaneous, negative for pain with forced dorsiflexion). He exhibits no edema or deformity.  Neurological: He is alert and oriented to person, place, and time. He has normal strength. He is not disoriented. No cranial nerve deficit or sensory deficit. He exhibits normal muscle tone. Coordination and gait normal.   Skin: Skin is warm.  Psychiatric: His behavior is normal.  Vitals reviewed.   Lab Results  Component Value Date   WBC 8.3 07/20/2015   HGB 16.1 07/20/2015   HCT 49.0 07/20/2015   MCV 89.4 07/20/2015    Lab Results  Component Value Date   NA 134 (L) 06/26/2016   K 4.1 06/26/2016   CL 100 06/26/2016   CO2 23 06/26/2016    Lab Results  Component Value Date   CREATININE 0.85 06/26/2016    Lab Results  Component Value Date   ALT 32 06/26/2016   AST 22 06/26/2016   ALKPHOS 51 06/26/2016   BILITOT 0.7 06/26/2016    Lab Results  Component Value Date   TSH 0.57 06/26/2016    Lab Results  Component Value Date   HGBA1C 5.6 07/20/2015   No results found for this or any previous visit (from the past 72 hour(s)).  No results found.  ASSESSMENT AND PLAN:  Macallister was seen today for follow-up.  Diagnoses and all orders for this visit:  Plantar fasciitis, bilateral: No history of diabetes. Given the likely spur about the plantar fascia on the left side I think it is best he see podiatry given the treatment recalcitrant nature of this condition.   -     DG Foot 2 Views Left; Future -  DG Foot 2 Views Right; Future -     meloxicam (MOBIC) 15 MG tablet; Take 1 tablet (15 mg total) by mouth daily. -     Ambulatory referral to Podiatry    The patient is advised to call or return to clinic if he does not see an improvement in symptoms, or to seek the care of the closest emergency department if he worsens with the above plan.   Deliah Boston, MHS, PA-C [facility] 02/05/2017 1:12 PM

## 2017-03-04 ENCOUNTER — Ambulatory Visit: Payer: BLUE CROSS/BLUE SHIELD

## 2017-03-04 ENCOUNTER — Encounter: Payer: Self-pay | Admitting: Podiatry

## 2017-03-04 ENCOUNTER — Ambulatory Visit (INDEPENDENT_AMBULATORY_CARE_PROVIDER_SITE_OTHER): Payer: BLUE CROSS/BLUE SHIELD | Admitting: Podiatry

## 2017-03-04 DIAGNOSIS — M722 Plantar fascial fibromatosis: Secondary | ICD-10-CM

## 2017-03-04 MED ORDER — MELOXICAM 15 MG PO TABS: 15.0000 mg | ORAL_TABLET | Freq: Every day | ORAL | 3 refills | Status: AC

## 2017-03-04 MED ORDER — METHYLPREDNISOLONE 4 MG PO TBPK: ORAL_TABLET | ORAL | 0 refills | Status: AC

## 2017-03-04 NOTE — Patient Instructions (Signed)

## 2017-03-04 NOTE — Progress Notes (Signed)
   Subjective:    Patient ID: Albert Farrell, male    DOB: 07/01/1981, 36 y.o.   MRN: [id number]  HPI: He presents today with a several month duration of pain to the bilateral heels he states that his right heel is worse than his left. Mornings are particularly bad and after he's been working all day and gets from takes her shoes off sits for a while and gets back up.  Review of Systems  Constitutional: Positive for activity change.  Musculoskeletal: Positive for back pain, gait problem and myalgias.  Neurological: Positive for weakness.  All other systems reviewed and are negative.      Objective:   Physical Exam: Vital signs are stable alert and oriented 3. Pulses are palpable. Neurologic sensorium is intact. Deep tendon reflexes are intact. Orthopedic evaluation demonstrates pain on palpation medial calcaneal tubercle bilateral. Radiographs demonstrate plantar distally or any calcaneal heel spur with soft tissue increase in density plantar fascia calcaneal insertion site.       Assessment & Plan:  Plantar fascitis right.  Plan: Injected the right heel today with Kenalog and anesthetic discussed appropriate shoe gear to change size ice therapy shear modifications. Start him on a Medrol Dosepak to be followed by meloxicam. Placement for fascial brace and a night splint. His custom appropriate shoe gear stretching exercises ice therapy shear modifications. Follow-up with me in 1 month when he consider orthotics at that time

## 2017-04-01 ENCOUNTER — Ambulatory Visit: Payer: BLUE CROSS/BLUE SHIELD | Admitting: Podiatry

## 2017-04-10 ENCOUNTER — Ambulatory Visit: Payer: BLUE CROSS/BLUE SHIELD | Admitting: Podiatry

## 2017-05-06 ENCOUNTER — Encounter (INDEPENDENT_AMBULATORY_CARE_PROVIDER_SITE_OTHER): Payer: BLUE CROSS/BLUE SHIELD | Admitting: Podiatry

## 2017-05-06 NOTE — Progress Notes (Signed)
This encounter was created in error - please disregard.

## 2018-11-05 IMAGING — DX DG FOOT 2V*R*
2 series · 2 of 2 positions shown · non-contrast
Comparison: None.

CLINICAL DATA: Bilateral foot pain, left worse than right. No known
injury.

EXAM:
RIGHT FOOT - 2 VIEW

[foot ap]
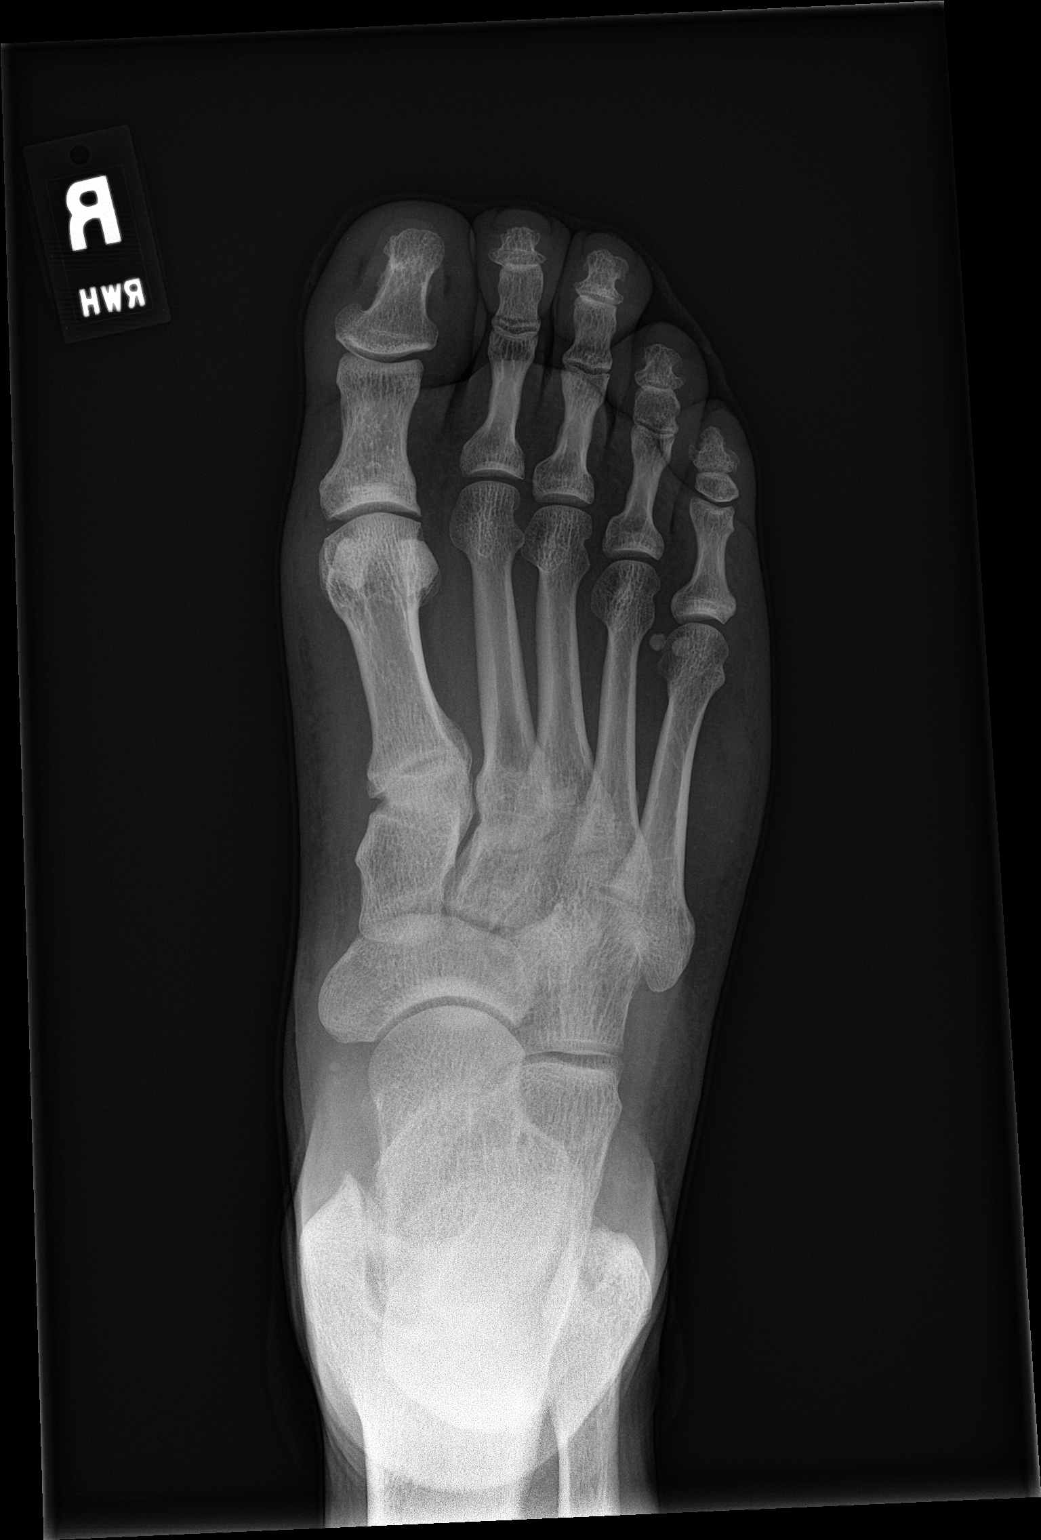

[foot lat]
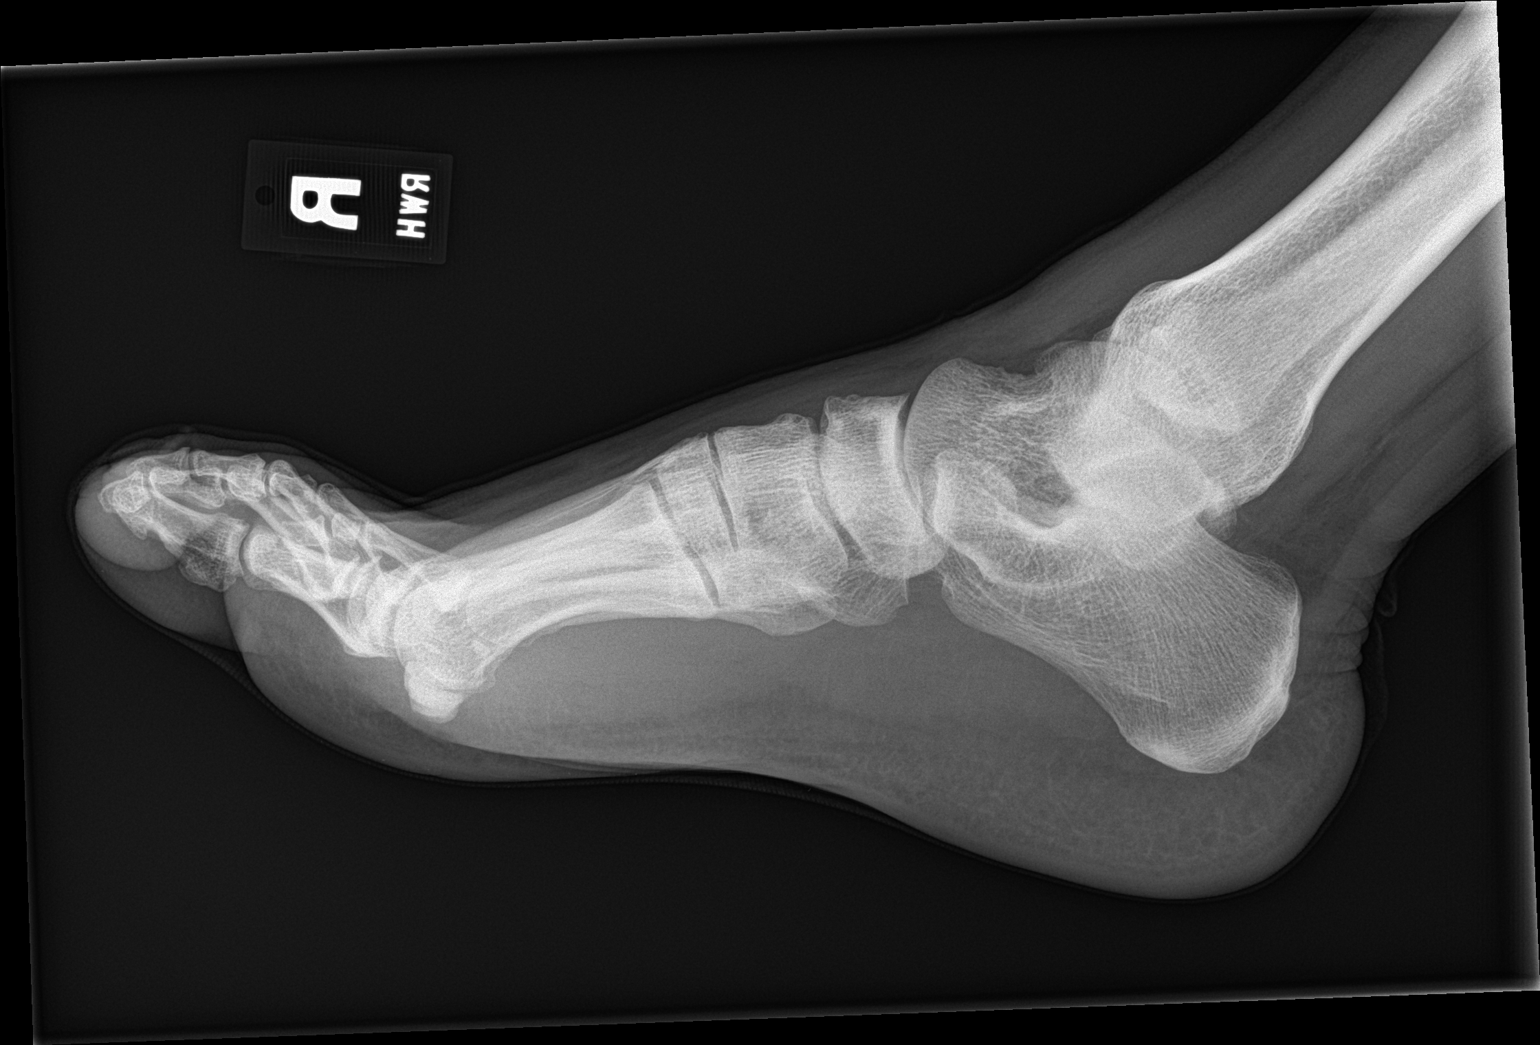

[2 of 2 positions shown; findings below may reference images not displayed]

FINDINGS: There is no evidence of fracture or dislocation. There is no
evidence of arthropathy or other focal bone abnormality. Soft
tissues are unremarkable.
IMPRESSION: Negative exam.

## 2022-12-13 ENCOUNTER — Ambulatory Visit: Payer: Self-pay | Admitting: Student
